# Patient Record
Sex: Female | Born: 1950 | Race: White | Hispanic: No | Smoking: Never smoker
Health system: Southern US, Community
[De-identification: ages and names within clinical notes are randomized; demographics above are authoritative.]

## PROBLEM LIST (undated history)

## (undated) DIAGNOSIS — E785 Hyperlipidemia, unspecified: Secondary | ICD-10-CM

## (undated) DIAGNOSIS — C4490 Unspecified malignant neoplasm of skin, unspecified: Secondary | ICD-10-CM

## (undated) DIAGNOSIS — Z923 Personal history of irradiation: Secondary | ICD-10-CM

## (undated) DIAGNOSIS — C50919 Malignant neoplasm of unspecified site of unspecified female breast: Secondary | ICD-10-CM

## (undated) HISTORY — PX: BUNIONECTOMY: SHX129

## (undated) HISTORY — PX: TUBAL LIGATION: SHX77

---

## 2001-01-03 DIAGNOSIS — C50919 Malignant neoplasm of unspecified site of unspecified female breast: Secondary | ICD-10-CM

## 2001-01-03 DIAGNOSIS — Z923 Personal history of irradiation: Secondary | ICD-10-CM

## 2001-01-03 HISTORY — DX: Malignant neoplasm of unspecified site of unspecified female breast: C50.919

## 2001-01-03 HISTORY — DX: Personal history of irradiation: Z92.3

## 2001-01-03 HISTORY — PX: BREAST LUMPECTOMY: SHX2

## 2001-01-03 HISTORY — PX: BREAST EXCISIONAL BIOPSY: SUR124

## 2003-11-20 ENCOUNTER — Ambulatory Visit: Payer: Self-pay | Admitting: Oncology

## 2004-01-26 ENCOUNTER — Ambulatory Visit: Payer: Self-pay | Admitting: General Surgery

## 2004-08-16 ENCOUNTER — Ambulatory Visit: Payer: Self-pay | Admitting: General Surgery

## 2005-08-10 ENCOUNTER — Emergency Department: Payer: Self-pay | Admitting: Emergency Medicine

## 2005-09-21 ENCOUNTER — Ambulatory Visit: Payer: Self-pay | Admitting: General Surgery

## 2006-04-04 ENCOUNTER — Ambulatory Visit: Payer: Self-pay | Admitting: Oncology

## 2006-04-26 ENCOUNTER — Ambulatory Visit: Payer: Self-pay | Admitting: Oncology

## 2006-05-04 ENCOUNTER — Ambulatory Visit: Payer: Self-pay | Admitting: Oncology

## 2006-08-09 ENCOUNTER — Ambulatory Visit: Payer: Self-pay | Admitting: Specialist

## 2006-10-04 ENCOUNTER — Ambulatory Visit: Payer: Self-pay | Admitting: Oncology

## 2006-10-25 ENCOUNTER — Ambulatory Visit: Payer: Self-pay | Admitting: Oncology

## 2006-10-30 ENCOUNTER — Ambulatory Visit: Payer: Self-pay | Admitting: General Surgery

## 2006-11-04 ENCOUNTER — Ambulatory Visit: Payer: Self-pay | Admitting: Oncology

## 2007-04-04 ENCOUNTER — Ambulatory Visit: Payer: Self-pay | Admitting: Oncology

## 2007-04-05 ENCOUNTER — Ambulatory Visit: Payer: Self-pay | Admitting: Specialist

## 2007-04-23 ENCOUNTER — Ambulatory Visit: Payer: Self-pay | Admitting: Oncology

## 2007-05-04 ENCOUNTER — Ambulatory Visit: Payer: Self-pay | Admitting: Oncology

## 2007-10-04 ENCOUNTER — Ambulatory Visit: Payer: Self-pay | Admitting: Oncology

## 2007-10-29 ENCOUNTER — Ambulatory Visit: Payer: Self-pay | Admitting: Oncology

## 2007-11-04 ENCOUNTER — Ambulatory Visit: Payer: Self-pay | Admitting: Oncology

## 2007-12-03 ENCOUNTER — Ambulatory Visit: Payer: Self-pay | Admitting: General Surgery

## 2008-10-27 ENCOUNTER — Ambulatory Visit: Payer: Self-pay | Admitting: Oncology

## 2008-11-03 ENCOUNTER — Ambulatory Visit: Payer: Self-pay | Admitting: Oncology

## 2008-11-10 ENCOUNTER — Ambulatory Visit: Payer: Self-pay | Admitting: Physician Assistant

## 2008-11-21 ENCOUNTER — Ambulatory Visit: Payer: Self-pay | Admitting: Internal Medicine

## 2008-11-21 ENCOUNTER — Emergency Department: Payer: Self-pay | Admitting: Emergency Medicine

## 2008-12-01 ENCOUNTER — Ambulatory Visit: Payer: Self-pay | Admitting: Oncology

## 2008-12-16 ENCOUNTER — Ambulatory Visit: Payer: Self-pay | Admitting: General Surgery

## 2009-11-23 ENCOUNTER — Ambulatory Visit: Payer: Self-pay | Admitting: Oncology

## 2009-12-03 ENCOUNTER — Ambulatory Visit: Payer: Self-pay | Admitting: Oncology

## 2010-01-03 ENCOUNTER — Ambulatory Visit: Payer: Self-pay | Admitting: Oncology

## 2010-11-23 ENCOUNTER — Ambulatory Visit: Payer: Self-pay | Admitting: Oncology

## 2010-12-04 ENCOUNTER — Ambulatory Visit: Payer: Self-pay | Admitting: Oncology

## 2010-12-21 ENCOUNTER — Ambulatory Visit: Payer: Self-pay | Admitting: Internal Medicine

## 2011-12-22 ENCOUNTER — Ambulatory Visit: Payer: Self-pay | Admitting: Internal Medicine

## 2012-09-26 ENCOUNTER — Ambulatory Visit: Payer: Self-pay | Admitting: Unknown Physician Specialty

## 2013-01-09 ENCOUNTER — Ambulatory Visit: Payer: Self-pay | Admitting: Internal Medicine

## 2014-02-10 ENCOUNTER — Ambulatory Visit: Payer: Self-pay | Admitting: Internal Medicine

## 2015-02-16 ENCOUNTER — Other Ambulatory Visit: Payer: Self-pay | Admitting: Internal Medicine

## 2015-02-16 DIAGNOSIS — Z1231 Encounter for screening mammogram for malignant neoplasm of breast: Secondary | ICD-10-CM

## 2015-02-19 ENCOUNTER — Ambulatory Visit
Admission: RE | Admit: 2015-02-19 | Discharge: 2015-02-19 | Disposition: A | Discharge status: Home / Self Care | Payer: Medicare Other | Source: Ambulatory Visit | Attending: Internal Medicine | Admitting: Internal Medicine

## 2015-02-19 ENCOUNTER — Other Ambulatory Visit: Payer: Self-pay | Admitting: Internal Medicine

## 2015-02-19 DIAGNOSIS — Z1231 Encounter for screening mammogram for malignant neoplasm of breast: Secondary | ICD-10-CM | POA: Insufficient documentation

## 2015-02-19 HISTORY — DX: Unspecified malignant neoplasm of skin, unspecified: C44.90

## 2015-02-19 HISTORY — DX: Malignant neoplasm of unspecified site of unspecified female breast: C50.919

## 2016-01-07 ENCOUNTER — Other Ambulatory Visit: Payer: Self-pay | Admitting: Internal Medicine

## 2016-01-07 DIAGNOSIS — Z1231 Encounter for screening mammogram for malignant neoplasm of breast: Secondary | ICD-10-CM

## 2016-02-22 ENCOUNTER — Ambulatory Visit
Admission: RE | Admit: 2016-02-22 | Discharge: 2016-02-22 | Disposition: A | Discharge status: Home / Self Care | Payer: Medicare Other | Source: Ambulatory Visit | Attending: Internal Medicine | Admitting: Internal Medicine

## 2016-02-22 DIAGNOSIS — Z1231 Encounter for screening mammogram for malignant neoplasm of breast: Secondary | ICD-10-CM | POA: Insufficient documentation

## 2017-03-15 ENCOUNTER — Other Ambulatory Visit: Payer: Self-pay | Admitting: Internal Medicine

## 2017-03-15 DIAGNOSIS — Z1231 Encounter for screening mammogram for malignant neoplasm of breast: Secondary | ICD-10-CM

## 2017-03-17 ENCOUNTER — Ambulatory Visit
Admission: RE | Admit: 2017-03-17 | Discharge: 2017-03-17 | Disposition: A | Discharge status: Home / Self Care | Payer: Medicare Other | Source: Ambulatory Visit | Attending: Internal Medicine | Admitting: Internal Medicine

## 2017-03-17 DIAGNOSIS — Z1231 Encounter for screening mammogram for malignant neoplasm of breast: Secondary | ICD-10-CM | POA: Diagnosis present

## 2017-03-17 HISTORY — DX: Personal history of irradiation: Z92.3

## 2018-03-16 ENCOUNTER — Other Ambulatory Visit: Payer: Self-pay | Admitting: Internal Medicine

## 2018-03-26 ENCOUNTER — Other Ambulatory Visit: Payer: Self-pay | Admitting: Internal Medicine

## 2018-03-26 DIAGNOSIS — M7989 Other specified soft tissue disorders: Secondary | ICD-10-CM

## 2018-04-17 ENCOUNTER — Other Ambulatory Visit: Payer: Self-pay | Admitting: Internal Medicine

## 2018-04-17 DIAGNOSIS — M7989 Other specified soft tissue disorders: Secondary | ICD-10-CM

## 2018-04-18 ENCOUNTER — Ambulatory Visit
Admission: RE | Admit: 2018-04-18 | Discharge: 2018-04-18 | Disposition: A | Discharge status: Home / Self Care | Payer: Medicare Other | Source: Ambulatory Visit | Attending: Internal Medicine | Admitting: Internal Medicine

## 2018-04-18 ENCOUNTER — Other Ambulatory Visit: Payer: Self-pay

## 2018-04-18 DIAGNOSIS — M7989 Other specified soft tissue disorders: Secondary | ICD-10-CM

## 2019-05-23 ENCOUNTER — Other Ambulatory Visit: Payer: Self-pay | Admitting: Internal Medicine

## 2019-05-23 DIAGNOSIS — Z1231 Encounter for screening mammogram for malignant neoplasm of breast: Secondary | ICD-10-CM

## 2019-05-29 ENCOUNTER — Ambulatory Visit
Admission: RE | Admit: 2019-05-29 | Discharge: 2019-05-29 | Disposition: A | Discharge status: Home / Self Care | Payer: Medicare PPO | Source: Ambulatory Visit | Attending: Internal Medicine | Admitting: Internal Medicine

## 2019-05-29 DIAGNOSIS — Z1231 Encounter for screening mammogram for malignant neoplasm of breast: Secondary | ICD-10-CM

## 2019-07-26 ENCOUNTER — Other Ambulatory Visit: Payer: Self-pay | Admitting: Student

## 2019-07-26 DIAGNOSIS — S83282A Other tear of lateral meniscus, current injury, left knee, initial encounter: Secondary | ICD-10-CM

## 2019-07-27 ENCOUNTER — Other Ambulatory Visit: Payer: Self-pay

## 2019-07-27 ENCOUNTER — Ambulatory Visit
Admission: RE | Admit: 2019-07-27 | Discharge: 2019-07-27 | Disposition: A | Discharge status: Home / Self Care | Payer: Medicare PPO | Source: Ambulatory Visit | Attending: Student | Admitting: Student

## 2019-07-27 DIAGNOSIS — S83282A Other tear of lateral meniscus, current injury, left knee, initial encounter: Secondary | ICD-10-CM | POA: Insufficient documentation

## 2019-08-21 ENCOUNTER — Other Ambulatory Visit: Payer: Self-pay | Admitting: Surgery

## 2019-08-26 ENCOUNTER — Encounter
Admission: RE | Admit: 2019-08-26 | Discharge: 2019-08-26 | Disposition: A | Discharge status: Home / Self Care | Payer: Medicare PPO | Source: Ambulatory Visit | Attending: Surgery | Admitting: Surgery

## 2019-08-26 ENCOUNTER — Other Ambulatory Visit: Payer: Self-pay

## 2019-08-26 HISTORY — DX: Hyperlipidemia, unspecified: E78.5

## 2019-08-26 NOTE — Patient Instructions (Signed)
Your procedure is scheduled on: 08/28/19 Report to [facility]. To find out your arrival time please call [phone] between 1PM - 3PM on 08/27/19.  Remember: Instructions that are not followed completely may result in serious medical risk, up to and including death, or upon the discretion of your surgeon and anesthesiologist your surgery may need to be rescheduled.     _X__ 1. Do not eat food after midnight the night before your procedure.                 No gum chewing or hard candies. You may drink clear liquids up to 2 hours                 before you are scheduled to arrive for your surgery- DO not drink clear                 liquids within 2 hours of the start of your surgery.                 Clear Liquids include:  water, apple juice without pulp, clear carbohydrate                 drink such as Clearfast or Gatorade, Black Coffee or Tea (Do not add                 anything to coffee or tea). Diabetics water only  ENSURE "CLEAR" PRE SURGERY DRINK TO E CONSUMED 2 HOURS BEFORE ARRIVING  __X__2.  On the morning of surgery brush your teeth with toothpaste and water, you                 may rinse your mouth with mouthwash if you wish.  Do not swallow any              toothpaste of mouthwash.     _X__ 3.  No Alcohol for 24 hours before or after surgery.   _X__ 4.  Do Not Smoke or use e-cigarettes For 24 Hours Prior to Your Surgery.                 Do not use any chewable tobacco products for at least 6 hours prior to                 surgery.  ____  5.  Bring all medications with you on the day of surgery if instructed.   __X__  6.  Notify your doctor if there is any change in your medical condition      (cold, fever, infections).     Do not wear jewelry, make-up, hairpins, clips or nail polish. Do not wear lotions, powders, or perfumes.  Do not shave 48 hours prior to surgery. Men may shave face and neck. Do not bring valuables  to the hospital.    [facility] is not responsible for any belongings or valuables.  Contacts, dentures/partials or body piercings may not be worn into surgery. Bring a case for your contacts, glasses or hearing aids, a denture cup will be supplied. Leave your suitcase in the car. After surgery it may be brought to your room. For patients admitted to the hospital, discharge time is determined by your treatment team.   Patients discharged the day of surgery will not be allowed to drive home.   Please read over the following fact sheets that you were given:   MRSA Information  __X__ Take  these medicines the morning of surgery with A SIP OF WATER:    1. ezetimibe (ZETIA) 10 MG tablet  2. venlafaxine XR (EFFEXOR-XR) 75 MG 24 hr capsule  3.   4.  5.  6.  ____ Fleet Enema (as directed)   __X__ Use CHG Soap/SAGE wipes as directed  ____ Use inhalers on the day of surgery  ____ Stop metformin/Janumet/Farxiga 2 days prior to surgery    ____ Take 1/2 of usual insulin dose the night before surgery. No insulin the morning          of surgery.   ____ Stop Blood Thinners Coumadin/Plavix/Xarelto/Pleta/Pradaxa/Eliquis/Effient/Aspirin  on   Or contact your Surgeon, Cardiologist or Medical Doctor regarding  ability to stop your blood thinners  __X__ Stop Anti-inflammatories 7 days before surgery such as Advil, Ibuprofen, Motrin,  BC or Goodies Powder, Naprosyn, Naproxen, Aleve, Aspirin    __X__ Stop all herbal supplements, fish oil or vitamin E until after surgery.    ____ Bring C-Pap to the hospital.     How to Use an Incentive Spirometer An incentive spirometer is a tool that measures how well you are filling your lungs with each breath. Learning to take long, deep breaths using this tool can help you keep your lungs clear and active. This may help to reverse or lessen your chance of developing breathing (pulmonary) problems, especially infection. You may be asked to use a  spirometer:  After a surgery.  If you have a lung problem or a history of smoking.  After a long period of time when you have been unable to move or be active. If the spirometer includes an indicator to show the highest number that you have reached, your health care provider or respiratory therapist will help you set a goal. Keep a list (log) of your progress as told by your health care provider. What are the risks?  Breathing too quickly may cause dizziness or cause you to pass out. Take your time so you do not get dizzy or light-headed.  If you are in pain, you may need to take pain medicine before doing incentive spirometry. It is harder to take a deep breath if you are having pain. How to use your incentive spirometer  1. Sit up on the edge of your bed or on a chair. 2. Hold the incentive spirometer so that it is in an upright position. 3. Before you use the spirometer, breathe out normally. 4. Place the mouthpiece in your mouth. Make sure your lips are closed tightly around it. 5. Breathe in slowly and as deeply as you can through your mouth, causing the piston or the ball to rise toward the top of the chamber. 6. Hold your breath for 3-5 seconds, or for as long as possible. ? If the spirometer includes a coach indicator, use this to guide you in breathing. Slow down your breathing if the indicator goes above the marked areas. 7. Remove the mouthpiece from your mouth and breathe out normally. The piston or ball will return to the bottom of the chamber. 8. Rest for a few seconds, then repeat the steps 10 or more times. ? Take your time and take a few normal breaths between deep breaths so that you do not get dizzy or light-headed. ? Do this every 1-2 hours when you are awake. 9. If the spirometer includes a goal marker to show the highest number you have reached (best effort), use this as a goal to work toward during  each repetition. 10. After each set of 10 deep breaths, cough a few  times. This will help to make sure that your lungs are clear. ? If you have an incision on your chest or abdomen from surgery, place a pillow or a rolled-up towel firmly against the incision when you cough. This can help to reduce pain from coughing. General tips  When you become able to get out of bed, walk around often and continue to cough to help clear your lungs.  Keep using the incentive spirometer until your health care provider says it is okay to stop using it. If you have been in the hospital, you may be told to keep using the spirometer at home. Contact a health care provider if:  You are having difficulty using the spirometer.  You have trouble using the spirometer as often as instructed.  Your pain medicine is not giving enough relief for you to use the spirometer as told.  You have a fever.  You develop shortness of breath. Get help right away if:  You develop a cough with bloody mucus from the lungs (bloody sputum).  You have fluid or blood coming from an incision site after you cough. Summary  An incentive spirometer is a tool that can help you learn to take long, deep breaths to keep your lungs clear and active.  You may be asked to use a spirometer after a surgery, if you have a lung problem or a history of smoking, or if you have been inactive for a long period of time.  Use your incentive spirometer as instructed every 1-2 hours while you are awake.  If you have an incision on your chest or abdomen, place a pillow or a rolled-up towel firmly against your incision when you cough. This will help to reduce pain. This information is not intended to replace advice given to you by your health care provider. Make sure you discuss any questions you have with your health care provider. Document Revised: 07/20/2018 Document Reviewed: 11/02/2016 Elsevier Patient Education  2020 [employer].

## 2019-08-27 ENCOUNTER — Other Ambulatory Visit: Payer: Medicare PPO

## 2019-08-27 ENCOUNTER — Other Ambulatory Visit
Admission: RE | Admit: 2019-08-27 | Discharge: 2019-08-27 | Disposition: A | Discharge status: Home / Self Care | Payer: Medicare PPO | Source: Ambulatory Visit | Attending: Surgery | Admitting: Surgery

## 2019-08-27 DIAGNOSIS — Z01818 Encounter for other preprocedural examination: Secondary | ICD-10-CM | POA: Insufficient documentation

## 2019-08-27 DIAGNOSIS — Z20822 Contact with and (suspected) exposure to covid-19: Secondary | ICD-10-CM | POA: Insufficient documentation

## 2019-08-27 LAB — SARS CORONAVIRUS 2 (TAT 6-24 HRS): SARS Coronavirus 2: NEGATIVE

## 2019-08-28 ENCOUNTER — Inpatient Hospital Stay: Admission: RE | Admit: 2019-08-28 | Payer: Medicare PPO | Source: Ambulatory Visit

## 2019-08-28 ENCOUNTER — Ambulatory Visit: Payer: Medicare PPO | Admitting: Anesthesiology

## 2019-08-28 ENCOUNTER — Ambulatory Visit
Admission: RE | Admit: 2019-08-28 | Discharge: 2019-08-28 | Disposition: A | Discharge status: Home / Self Care | Payer: Medicare PPO | Attending: Surgery | Admitting: Surgery

## 2019-08-28 ENCOUNTER — Other Ambulatory Visit: Payer: Self-pay

## 2019-08-28 ENCOUNTER — Encounter
Admission: RE | Disposition: A | Discharge status: Home / Self Care | Payer: Self-pay | Source: Home / Self Care | Attending: Surgery

## 2019-08-28 ENCOUNTER — Encounter: Payer: Self-pay | Admitting: Surgery

## 2019-08-28 DIAGNOSIS — I10 Essential (primary) hypertension: Secondary | ICD-10-CM | POA: Insufficient documentation

## 2019-08-28 DIAGNOSIS — F419 Anxiety disorder, unspecified: Secondary | ICD-10-CM | POA: Diagnosis not present

## 2019-08-28 DIAGNOSIS — Z79899 Other long term (current) drug therapy: Secondary | ICD-10-CM | POA: Diagnosis not present

## 2019-08-28 DIAGNOSIS — M1712 Unilateral primary osteoarthritis, left knee: Secondary | ICD-10-CM | POA: Diagnosis not present

## 2019-08-28 DIAGNOSIS — Y9389 Activity, other specified: Secondary | ICD-10-CM | POA: Diagnosis not present

## 2019-08-28 DIAGNOSIS — Z8249 Family history of ischemic heart disease and other diseases of the circulatory system: Secondary | ICD-10-CM | POA: Diagnosis not present

## 2019-08-28 DIAGNOSIS — Z809 Family history of malignant neoplasm, unspecified: Secondary | ICD-10-CM | POA: Insufficient documentation

## 2019-08-28 DIAGNOSIS — S83282A Other tear of lateral meniscus, current injury, left knee, initial encounter: Secondary | ICD-10-CM | POA: Diagnosis present

## 2019-08-28 DIAGNOSIS — Z923 Personal history of irradiation: Secondary | ICD-10-CM | POA: Insufficient documentation

## 2019-08-28 DIAGNOSIS — Z888 Allergy status to other drugs, medicaments and biological substances status: Secondary | ICD-10-CM | POA: Diagnosis not present

## 2019-08-28 DIAGNOSIS — S83272A Complex tear of lateral meniscus, current injury, left knee, initial encounter: Secondary | ICD-10-CM | POA: Insufficient documentation

## 2019-08-28 DIAGNOSIS — Z8042 Family history of malignant neoplasm of prostate: Secondary | ICD-10-CM | POA: Insufficient documentation

## 2019-08-28 DIAGNOSIS — F329 Major depressive disorder, single episode, unspecified: Secondary | ICD-10-CM | POA: Insufficient documentation

## 2019-08-28 DIAGNOSIS — Z803 Family history of malignant neoplasm of breast: Secondary | ICD-10-CM | POA: Insufficient documentation

## 2019-08-28 DIAGNOSIS — E785 Hyperlipidemia, unspecified: Secondary | ICD-10-CM | POA: Insufficient documentation

## 2019-08-28 DIAGNOSIS — W2209XA Striking against other stationary object, initial encounter: Secondary | ICD-10-CM | POA: Diagnosis not present

## 2019-08-28 DIAGNOSIS — Z853 Personal history of malignant neoplasm of breast: Secondary | ICD-10-CM | POA: Insufficient documentation

## 2019-08-28 DIAGNOSIS — Z791 Long term (current) use of non-steroidal anti-inflammatories (NSAID): Secondary | ICD-10-CM | POA: Insufficient documentation

## 2019-08-28 HISTORY — PX: KNEE ARTHROSCOPY WITH MENISCAL REPAIR: SHX5653

## 2019-08-28 SURGERY — ARTHROSCOPY, KNEE, WITH MENISCUS REPAIR
Anesthesia: General | Site: Knee | Laterality: Left

## 2019-08-28 MED ORDER — PROPOFOL 10 MG/ML IV BOLUS
INTRAVENOUS | Status: AC
  Filled 2019-08-28: qty 40

## 2019-08-28 MED ORDER — ONDANSETRON HCL 4 MG PO TABS: 4.0000 mg | ORAL_TABLET | Freq: Four times a day (QID) | ORAL | Status: DC | PRN

## 2019-08-28 MED ORDER — FENTANYL CITRATE (PF) 100 MCG/2ML IJ SOLN: 25.0000 ug | INTRAMUSCULAR | Status: DC | PRN

## 2019-08-28 MED ORDER — HYDROCODONE-ACETAMINOPHEN 5-325 MG PO TABS: 1.0000 | ORAL_TABLET | Freq: Four times a day (QID) | ORAL | 0 refills | Status: AC | PRN

## 2019-08-28 MED ORDER — DEXAMETHASONE SODIUM PHOSPHATE 10 MG/ML IJ SOLN
INTRAMUSCULAR | Status: DC | PRN
  Administered 2019-08-28: 5 mg via INTRAVENOUS

## 2019-08-28 MED ORDER — ONDANSETRON HCL 4 MG/2ML IJ SOLN
INTRAMUSCULAR | Status: DC | PRN
  Administered 2019-08-28: 4 mg via INTRAVENOUS

## 2019-08-28 MED ORDER — CEFAZOLIN SODIUM-DEXTROSE 2-4 GM/100ML-% IV SOLN
2.0000 g | INTRAVENOUS | Status: AC
  Administered 2019-08-28: 2 g via INTRAVENOUS

## 2019-08-28 MED ORDER — ORAL CARE MOUTH RINSE: 15.0000 mL | Freq: Once | OROMUCOSAL | Status: AC

## 2019-08-28 MED ORDER — METOCLOPRAMIDE HCL 5 MG/ML IJ SOLN: 5.0000 mg | Freq: Three times a day (TID) | INTRAMUSCULAR | Status: DC | PRN

## 2019-08-28 MED ORDER — PROPOFOL 10 MG/ML IV BOLUS
INTRAVENOUS | Status: DC | PRN
  Administered 2019-08-28: 150 mg via INTRAVENOUS

## 2019-08-28 MED ORDER — CHLORHEXIDINE GLUCONATE 0.12 % MT SOLN: 15.0000 mL | Freq: Once | OROMUCOSAL | Status: AC

## 2019-08-28 MED ORDER — EPHEDRINE SULFATE 50 MG/ML IJ SOLN
INTRAMUSCULAR | Status: DC | PRN
  Administered 2019-08-28 (×2): 5 mg via INTRAVENOUS

## 2019-08-28 MED ORDER — POTASSIUM CHLORIDE IN NACL 20-0.9 MEQ/L-% IV SOLN: INTRAVENOUS | Status: DC

## 2019-08-28 MED ORDER — FENTANYL CITRATE (PF) 100 MCG/2ML IJ SOLN
INTRAMUSCULAR | Status: AC
  Filled 2019-08-28: qty 2

## 2019-08-28 MED ORDER — LIDOCAINE HCL (PF) 1 % IJ SOLN
INTRAMUSCULAR | Status: AC
  Filled 2019-08-28: qty 30

## 2019-08-28 MED ORDER — FAMOTIDINE 20 MG PO TABS
ORAL_TABLET | ORAL | Status: AC
  Administered 2019-08-28: 20 mg via ORAL
  Filled 2019-08-28: qty 1

## 2019-08-28 MED ORDER — BUPIVACAINE-EPINEPHRINE (PF) 0.5% -1:200000 IJ SOLN
INTRAMUSCULAR | Status: AC
  Filled 2019-08-28: qty 30

## 2019-08-28 MED ORDER — FENTANYL CITRATE (PF) 100 MCG/2ML IJ SOLN
INTRAMUSCULAR | Status: AC
Start: 2019-08-28 — End: 2019-08-28
  Administered 2019-08-28: 50 ug via INTRAVENOUS
  Filled 2019-08-28: qty 2

## 2019-08-28 MED ORDER — METOCLOPRAMIDE HCL 10 MG PO TABS: 5.0000 mg | ORAL_TABLET | Freq: Three times a day (TID) | ORAL | Status: DC | PRN

## 2019-08-28 MED ORDER — CHLORHEXIDINE GLUCONATE 0.12 % MT SOLN
OROMUCOSAL | Status: AC
  Administered 2019-08-28: 15 mL via OROMUCOSAL
  Filled 2019-08-28: qty 15

## 2019-08-28 MED ORDER — FENTANYL CITRATE (PF) 100 MCG/2ML IJ SOLN
INTRAMUSCULAR | Status: DC | PRN
Start: 2019-08-28 — End: 2019-08-28
  Administered 2019-08-28 (×2): 50 ug via INTRAVENOUS

## 2019-08-28 MED ORDER — ONDANSETRON HCL 4 MG/2ML IJ SOLN: 4.0000 mg | Freq: Once | INTRAMUSCULAR | Status: DC | PRN

## 2019-08-28 MED ORDER — PHENYLEPHRINE HCL (PRESSORS) 10 MG/ML IV SOLN
INTRAVENOUS | Status: DC | PRN
  Administered 2019-08-28 (×7): 100 ug via INTRAVENOUS

## 2019-08-28 MED ORDER — FAMOTIDINE 20 MG PO TABS: 20.0000 mg | ORAL_TABLET | Freq: Once | ORAL | Status: AC

## 2019-08-28 MED ORDER — BUPIVACAINE-EPINEPHRINE (PF) 0.5% -1:200000 IJ SOLN
INTRAMUSCULAR | Status: DC | PRN
  Administered 2019-08-28: 50 mL

## 2019-08-28 MED ORDER — LIDOCAINE HCL (CARDIAC) PF 100 MG/5ML IV SOSY
PREFILLED_SYRINGE | INTRAVENOUS | Status: DC | PRN
  Administered 2019-08-28: 100 mg via INTRAVENOUS

## 2019-08-28 MED ORDER — HYDROCODONE-ACETAMINOPHEN 5-325 MG PO TABS: 1.0000 | ORAL_TABLET | ORAL | Status: DC | PRN

## 2019-08-28 MED ORDER — LIDOCAINE HCL 1 % IJ SOLN
INTRAMUSCULAR | Status: DC | PRN
  Administered 2019-08-28: 30 mL

## 2019-08-28 MED ORDER — CEFAZOLIN SODIUM-DEXTROSE 2-4 GM/100ML-% IV SOLN
INTRAVENOUS | Status: AC
  Filled 2019-08-28: qty 100

## 2019-08-28 MED ORDER — LACTATED RINGERS IV SOLN: INTRAVENOUS | Status: DC

## 2019-08-28 MED ORDER — ONDANSETRON HCL 4 MG/2ML IJ SOLN: 4.0000 mg | Freq: Four times a day (QID) | INTRAMUSCULAR | Status: DC | PRN

## 2019-08-28 SURGICAL SUPPLY — 45 items
ANCH SUT 4.5 FTPRNT PEEK-OPTM (Anchor) ×1 IMPLANT
ANCHOR 4.5 FOOTPRINT ULTRA (Anchor) ×3 IMPLANT
APL PRP STRL LF DISP 70% ISPRP (MISCELLANEOUS) ×1
BAG COUNTER SPONGE EZ (MISCELLANEOUS) IMPLANT
BAG SPNG 4X4 CLR HAZ (MISCELLANEOUS)
BLADE FULL RADIUS 3.5 (BLADE) ×3 IMPLANT
BLADE SHAVER 4.5X7 STR FR (MISCELLANEOUS) ×3 IMPLANT
BNDG ELASTIC 6X5.8 VLCR STR LF (GAUZE/BANDAGES/DRESSINGS) ×3 IMPLANT
CARTRIDGE REPAIR MENISCAL SZ 0 (Anchor) ×2 IMPLANT
CHLORAPREP W/TINT 26 (MISCELLANEOUS) ×3 IMPLANT
COUNTER SPONGE BAG EZ (MISCELLANEOUS)
COVER WAND RF STERILE (DRAPES) ×3 IMPLANT
CUFF TOURN SGL QUICK 24 (TOURNIQUET CUFF)
CUFF TOURN SGL QUICK 30 (TOURNIQUET CUFF)
CUFF TRNQT CYL 24X4X16.5-23 (TOURNIQUET CUFF) IMPLANT
CUFF TRNQT CYL 30X4X21-28X (TOURNIQUET CUFF) IMPLANT
ELECT REM PT RETURN 9FT ADLT (ELECTROSURGICAL) ×3
ELECTRODE REM PT RTRN 9FT ADLT (ELECTROSURGICAL) ×1 IMPLANT
GAUZE SPONGE 4X4 12PLY STRL (GAUZE/BANDAGES/DRESSINGS) ×3 IMPLANT
GLOVE BIO SURGEON STRL SZ8 (GLOVE) ×6 IMPLANT
GLOVE BIOGEL M 7.0 STRL (GLOVE) ×6 IMPLANT
GLOVE BIOGEL PI IND STRL 7.5 (GLOVE) ×1 IMPLANT
GLOVE BIOGEL PI INDICATOR 7.5 (GLOVE) ×2
GLOVE INDICATOR 8.0 STRL GRN (GLOVE) ×3 IMPLANT
GOWN STRL REUS W/ TWL LRG LVL3 (GOWN DISPOSABLE) ×1 IMPLANT
GOWN STRL REUS W/ TWL XL LVL3 (GOWN DISPOSABLE) ×2 IMPLANT
GOWN STRL REUS W/TWL LRG LVL3 (GOWN DISPOSABLE) ×3
GOWN STRL REUS W/TWL XL LVL3 (GOWN DISPOSABLE) ×6
IV LACTATED RINGER IRRG 3000ML (IV SOLUTION) ×6
IV LR IRRIG 3000ML ARTHROMATIC (IV SOLUTION) ×2 IMPLANT
KIT MENISCAL ROOT REPAIR (KITS) ×3 IMPLANT
KIT TURNOVER KIT A (KITS) ×3 IMPLANT
MANAGER SUT NOVOCUT (CUTTER) ×3 IMPLANT
MANIFOLD NEPTUNE II (INSTRUMENTS) ×3 IMPLANT
MENISCAL REPAIR CARTRIDGE SZ 0 (Anchor) ×6 IMPLANT
MENISCAL REPAIR SYSTEM SZ 0 (Anchor) ×6 IMPLANT
NEEDLE HYPO 21X1.5 SAFETY (NEEDLE) ×3 IMPLANT
PACK KNEE ARTHRO (MISCELLANEOUS) ×3 IMPLANT
PENCIL ELECTRO HAND CTR (MISCELLANEOUS) ×3 IMPLANT
SUT PROLENE 4 0 PS 2 18 (SUTURE) ×3 IMPLANT
SUT TICRON COATED BLUE 2 0 30 (SUTURE) IMPLANT
SYR 50ML LL SCALE MARK (SYRINGE) ×3 IMPLANT
SYSTEM REPAIR MENISCAL SZ 0 (Anchor) ×2 IMPLANT
TUBING ARTHRO INFLOW-ONLY STRL (TUBING) ×3 IMPLANT
WAND WEREWOLF FLOW 90D (MISCELLANEOUS) ×3 IMPLANT

## 2019-08-28 NOTE — Anesthesia Preprocedure Evaluation (Signed)
Anesthesia Evaluation  Patient identified by MRN, date of birth, ID band Patient awake    Reviewed: Allergy & Precautions, H&P , NPO status , Patient's Chart, lab work & pertinent test results, reviewed documented beta blocker date and time   History of Anesthesia Complications Negative for: history of anesthetic complications  Airway Mallampati: II  TM Distance: >3 FB Neck ROM: full    Dental  (+) Dental Advidsory Given, Caps, Teeth Intact   Pulmonary neg pulmonary ROS,    Pulmonary exam normal breath sounds clear to auscultation       Cardiovascular Exercise Tolerance: Good hypertension, (-) angina(-) CAD, (-) Past MI and (-) Cardiac Stents Normal cardiovascular exam(-) dysrhythmias + Valvular Problems/Murmurs (as a teenager)  Rhythm:regular Rate:Normal     Neuro/Psych negative neurological ROS  negative psych ROS   GI/Hepatic negative GI ROS, Neg liver ROS,   Endo/Other  negative endocrine ROS  Renal/GU negative Renal ROS  negative genitourinary   Musculoskeletal   Abdominal   Peds  Hematology negative hematology ROS (+)   Anesthesia Other Findings Past Medical History: 2003: Breast cancer ([place])     Comment:  Left - radiation No date: HLD (hyperlipidemia) 2003: Personal history of radiation therapy     Comment:  Left lumpectomy No date: 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   Reproductive/Obstetrics negative OB ROS                             Anesthesia Physical Anesthesia Plan  ASA: II  Anesthesia Plan: General   Post-op Pain Management:    Induction: Intravenous  PONV Risk Score and Plan: 2 and Ondansetron, Dexamethasone and Treatment may vary due to age or medical condition  Airway Management Planned: LMA  Additional Equipment:   Intra-op Plan:   Post-operative Plan: Extubation in OR  Informed Consent: I have reviewed the patients History and  Physical, chart, labs and discussed the procedure including the risks, benefits and alternatives for the proposed anesthesia with the patient or authorized representative who has indicated his/her understanding and acceptance.     Dental Advisory Given  Plan Discussed with: Anesthesiologist, CRNA and Surgeon  Anesthesia Plan Comments:         Anesthesia Quick Evaluation

## 2019-08-28 NOTE — Transfer of Care (Signed)
Immediate Anesthesia Transfer of Care Note  Patient: Adriana Guzman  Procedure(s) Performed: KNEE ARTHROSCOPY WITH DEBRIDEMENT AND REPAIR OF LATERAL MENISCAL ROOT TEAR. (Left Knee)  Patient Location: PACU  Anesthesia Type:General  Level of Consciousness: awake and alert   Airway & Oxygen Therapy: Patient Spontanous Breathing  Post-op Assessment: Report given to RN and Post -op Vital signs reviewed and stable  Post vital signs: Reviewed and stable  Last Vitals:  Vitals Value Taken Time  BP 127/68 08/28/19 1047  Temp 36.3 C 08/28/19 1046  Pulse 87 08/28/19 1049  Resp 15 08/28/19 1049  SpO2 98 % 08/28/19 1049  Vitals shown include unvalidated device data.  Last Pain:  Vitals:   08/28/19 1046  TempSrc:   PainSc: 0-No pain         Complications: No complications documented.

## 2019-08-28 NOTE — Discharge Instructions (Addendum)
   AMBULATORY SURGERY  DISCHARGE INSTRUCTIONS   1) The drugs that you were given will stay in your system until tomorrow so for the next 24 hours you should not:  A) Drive an automobile B) Make any legal decisions C) Drink any alcoholic beverage   2) You may resume regular meals tomorrow.  Today it is better to start with liquids and gradually work up to solid foods.  You may eat anything you prefer, but it is better to start with liquids, then soup and crackers, and gradually work up to solid foods.   3) Please notify your doctor immediately if you have any unusual bleeding, trouble breathing, redness and pain at the surgery site, drainage, fever, or pain not relieved by medication.    4) Additional Instructions:        Please contact your physician with any problems or Same Day Surgery at [phone], Monday through Friday 6 am to 4 pm, or [place] at [facility] number at [phone].  Orthopedic discharge instructions: Keep dressing dry and intact.  May shower after dressing changed on post-op day #4 (Sunday).  Cover staples with Band-Aids after drying off. Apply ice frequently to knee. Take ibuprofen 600-800 mg TID with meals for 7-10 days, then as necessary. Take pain medication as prescribed when needed.  May supplement with ES Tylenol if necessary. No weight-bearing on left leg - use crutches or [place]. Follow-up in 10-14 days or as scheduled.

## 2019-08-28 NOTE — H&P (Signed)
History of Present Illness: Adriana Guzman is a 70 y.o.female who is being referred by Cameron Proud, PA-C, for left knee pain. The symptoms began several weeks ago and developed as result of an injury in which to [place] dogs ran up the steps to her and struck her in the left knee, causing him to bang against the railing. She felt a pop in her knee associated with sudden pain. She presented to an urgent care facility where x-rays were unremarkable. However, she was placed into a knee immobilizer and referred to orthopedics where she saw Cameron Proud, PA-C. Because of the concern for a meniscal tear, she was sent for an MRI scan and referred to me for further evaluation and treatment. She reports 5/10 pain. The pain is located along the lateral and posterior aspect of the knee. The pain is described as aching, stabbing and throbbing. The symptoms are aggravated with normal daily activities, using stairs, at higher levels of activity, walking, standing, standing pivot and activity in general. She also describes no mechanical symptoms. She has associated swelling and no deformity. She has tried acetaminophen, anti-inflammatories, narcotic medications, bracing and ice with limited benefit. The patient denies any prior injury to her knee, and denies any numbness or paresthesias down her leg to her foot.  Current Outpatient Medications: . acetaminophen (TYLENOL ARTHRITIS ORAL) Take 650 mg by mouth as needed  . ezetimibe (ZETIA) 10 mg tablet Take 1 tablet (10 mg total) by mouth once daily 90 tablet 3  . levothyroxine (SYNTHROID) 25 MCG tablet Take 1 tablet (25 mcg total) by mouth once daily Take on an empty stomach with a glass of water at least 30-60 minutes before breakfast. 90 tablet 3  . meloxicam (MOBIC) 15 MG tablet Take 1 tablet (15 mg total) by mouth once daily for 30 doses Take daily as needed. 30 tablet 0  . telmisartan (MICARDIS) 40 MG tablet Take 1 tablet (40 mg total) by mouth once daily  90 tablet 1  . venlafaxine (EFFEXOR-XR) 75 MG XR capsule Take 1 capsule (75 mg total) by mouth 2 (two) times daily 180 capsule 3   No current Epic-ordered facility-administered medications on file.   Allergies:  . Statins-Hmg-Coa Reductase Inhibitors Liver Disorder and Muscle Pain   Past Medical History:  . Anxiety  . Borderline hypertension  . Breast cancer (CMS-HCC)  status post lumpectomy with lymph node dissection and adjuvant radiation therapy.  . Depression  . Hyperlipidemia  . Hypertension September, 2015  . Mastitis  recurrent  . Menopausal state   Past Surgical History:  . Status post lumpectomy with lymph node dissection for breast cancer  . TUBAL LIGATION Bilateral   Family History:  . Heart disease Mother  . Cancer Mother  . Breast cancer Mother  . Myocardial Infarction (Heart attack) Father  . Coronary Artery Disease (Blocked arteries around heart) Father  . Prostate cancer Brother   Social History   Socioeconomic History  . Marital status: Divorced  Spouse name: Not on file  . Number of children: Not on file  . Years of education: Not on file  . Highest education level: Not on file  Occupational History  . Not on file  Tobacco Use  . Smoking status: Never Smoker  . Smokeless tobacco: Never Used  Substance and Sexual Activity  . Alcohol use: Never  Alcohol/week: 0.0 standard drinks  . Drug use: No  . Sexual activity: Not Currently  Partners: Male  Other Topics Concern  . Not  on file  Social History Narrative  . Not on file   Social Determinants of Health   Financial Resource Strain:  . Difficulty of Paying Living Expenses:  Food Insecurity:  . Worried About Charity fundraiser in the Last Year:  . Arboriculturist in the Last Year:  Transportation Needs:  . Film/video editor (Medical):  Marland Kitchen Lack of Transportation (Non-Medical):   Review of Systems:  A comprehensive 14 point ROS was performed, reviewed, and the pertinent orthopaedic  findings are documented in the HPI.  Physical Exam: Vitals:  08/02/19 0836  BP: 130/82  Weight: 76.9 kg (169 lb 9.6 oz)  Height: 172.7 cm (5\' 8" )  PainSc: 5  PainLoc: Knee   General/Constitutional: The patient appears to be well-nourished, well-developed, and in no acute distress. Neuro/Psych: Normal mood and affect, oriented to person, place and time. Eyes: Non-icteric. Pupils are equal, round, and reactive to light, and exhibit synchronous movement. Lymphatic: No palpable adenopathy. Respiratory: Lungs clear to auscultation, Normal chest excursion, No wheezes and Non-labored breathing Cardiovascular: Regular rate and rhythm. No murmurs. and No edema, swelling or tenderness, except as noted in detailed exam. Vascular: No edema, swelling or tenderness, except as noted in detailed exam. Integumentary: No impressive skin lesions present, except as noted in detailed exam. Musculoskeletal: Unremarkable, except as noted in detailed exam.  Left knee exam: GAIT: and uses  ALIGNMENT: normal SKIN: unremarkable SWELLING: minimal EFFUSION: absent WARMTH: no warmth TENDERNESS: moderate over the posterior lateral knee region and mild over the anteromedial joint line ROM: 5 to 125 degrees with pain at the extremes of flexion and extension. McMURRAY'S: equivocal PATELLOFEMORAL: normal tracking with no peri-patellar tenderness and negative apprehension sign CREPITUS: no LACHMAN'S: negative PIVOT SHIFT: negative ANTERIOR DRAWER: negative POSTERIOR DRAWER: negative VARUS/VALGUS: stable  She is neurovascularly intact to the left lower extremity and foot.  Knee Imaging, external: Left knee: A recent MRI scan of the left knee is available for review. By report, the scan demonstrates evidence of a posterior meniscal root of the lateral meniscus. The medial meniscus is in satisfactory condition, as the cruciate and collateral ligaments. There is mild chondromalacial changes involving the patella,  but the remaining areas of articular cartilage all appear to be in satisfactory condition. Both the films and report were reviewed by myself and discussed with the patient.  Assessment:  . Complex tear of lateral meniscus of left knee.   Plan: The treatment options were discussed with the patient. In addition, patient educational materials were provided regarding the diagnosis and treatment options. The patient is quite frustrated by her symptoms and functional limitations, and is ready to consider more aggressive treatment options. Therefore, I have recommended a surgical procedure, specifically a left knee arthroscopy with debridement and repair of her lateral meniscal root tear. The procedure was discussed with the patient, as were the potential risks (including bleeding, infection, nerve and/or blood vessel injury, persistent or recurrent pain, failure of the repair, progression of arthritis, need for further surgery, blood clots, strokes, heart attacks and/or arhythmias, pneumonia, etc.) and benefits. The patient states her understanding and wishes to proceed. All of the patient's questions and concerns were answered. She can call any time with further concerns. She has been fitted with a postoperative hinged knee range of motion brace to wear as necessary for comfort prior to surgery, but to definitely wear after surgery for at least 6 weeks. She will follow up post-surgery, routine.   H&P reviewed and patient re-examined. No  changes.

## 2019-08-28 NOTE — Op Note (Signed)
08/28/2019  10:57 AM  Patient:   Adriana Guzman  Pre-Op Diagnosis:   Lateral meniscus root tear, left knee.  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  Procedure:   Arthroscopic debridement with arthroscopically-assisted repair of lateral meniscus root tear, left knee.  Surgeon:   Pascal Lux, M.D.  Assistant:   None  Anesthesia:   General LMA.  Findings:   As above.  The medial meniscus was in excellent condition, as were the anterior and posterior cruciate ligaments.  There were grade 2-3 chondromalacial changes involving the femoral trochlea and grade 1 chondromalacial changes involving the medial and lateral femoral condyles.  Complications:   None.  EBL:   5 cc.  Total fluids:   600 cc of crystalloid.  Tourniquet time:   None  Drains:   None  Closure:   Staples.  Brief clinical note:   The patient is a 69 year old female who sustained the above-noted injury 6 to 7 weeks ago when she was brought into by two exuberant dogs. These symptoms have persisted despite medications, activity modification, etc. The patient's history and examination were consistent with a lateral meniscus tear. An MRI scan demonstrated the presence of a lateral meniscus root tear. The patient presents at this time for arthroscopy, debridement, and repair versus partial lateral meniscectomy of her left knee.  Procedure:   The patient was brought into the operating room and lain in the supine position. After adequate general laryngeal mask anesthesia was obtained, a timeout was performed to verify the appropriate side. The patient's left knee was injected sterilely using a solution of 30 cc of 1% lidocaine and 30 cc of 0.5% Sensorcaine with epinephrine. The left lower extremity was prepped with ChloraPrep solution before being draped sterilely. Preoperative antibiotics were administered. The expected portal sites were injected with 0.5%  Sensorcaine with epinephrine before the camera was placed in the anterolateral portal and instrumentation performed through the anteromedial portal. The knee was sequentially examined beginning in the suprapatellar pouch, then progressing to the patellofemoral space, the medial gutter compartment, the notch, and finally the lateral compartment and gutter. The findings were as described above. Abundant reactive synovial tissues anteriorly were debrided using the full-radius resector in order to improve visualization.   The lateral meniscus was carefully probed and demonstrated an unstable tear of the meniscal root. This was repaired using the [facility] meniscal root repair system. The end of the tear was freshened with a full-radius resector before the attachment site on the proximal tibia was curetted and debrided with the full-radius resector to expose good bleeding bone. The [facility] guide was positioned in the over-the-top position and, utilizing the 55 degree angle setting, the drill/sleeve combination was drilled up into the proximal tibia through a short anterior incision. Once its position was verified intra-articularly, the central drill was removed, leaving the sleeve in place. A looped passing suture was placed up through the retained sleeve and pulled out through the anteromedial wound. Utilizing the FirstPass suture passer, three #2 FiberWire sutures were placed into the meniscal root and tied securely. Using the passing loop, the FiberWire was drawn down through the drill hole in the proximal tibia and brought out anteriorly. A single [employer] anchor was placed in the anterior tibial cortex to secure the sutures. The repair was assessed and found to be stable to probing. It also appeared to be stable with range of motion of the  knee. The instruments were removed from the joint after suctioning the excess fluid.   The skin incision was closed using 4-0 Prolene interrupted  sutures. The portal sites also were closed using 4-0 Prolene interrupted sutures. A sterile bulky dressing was applied to the knee before the patient was placed into a hinged knee brace with the hinges set at 0-90, but locked in extension. The patient was then awakened, extubated, and returned to the recovery room in satisfactory condition after tolerating the procedure well.

## 2019-08-28 NOTE — Anesthesia Procedure Notes (Signed)
Procedure Name: LMA Insertion Date/Time: 08/28/2019 9:02 AM Performed by: Louann Sjogren, CRNA Pre-anesthesia Checklist: Patient identified, Patient being monitored, Timeout performed, Emergency Drugs available and Suction available Patient Re-evaluated:Patient Re-evaluated prior to induction Oxygen Delivery Method: Circle system utilized Preoxygenation: Pre-oxygenation with 100% oxygen Induction Type: IV induction Ventilation: Mask ventilation without difficulty LMA: LMA inserted LMA Size: 4.5 Tube type: Oral Number of attempts: 1 Placement Confirmation: positive ETCO2 and breath sounds checked- equal and bilateral Tube secured with: Tape Dental Injury: Teeth and Oropharynx as per pre-operative assessment

## 2019-08-29 NOTE — Anesthesia Postprocedure Evaluation (Signed)
Anesthesia Post Note  Patient: Marlowe Shores  Procedure(s) Performed: KNEE ARTHROSCOPY WITH DEBRIDEMENT AND REPAIR OF LATERAL MENISCAL ROOT TEAR. (Left Knee)  Patient location during evaluation: PACU Anesthesia Type: General Level of consciousness: awake and alert Pain management: pain level controlled Vital Signs Assessment: post-procedure vital signs reviewed and stable Respiratory status: spontaneous breathing, nonlabored ventilation, respiratory function stable and patient connected to nasal cannula oxygen Cardiovascular status: blood pressure returned to baseline and stable Postop Assessment: no apparent nausea or vomiting Anesthetic complications: no   No complications documented.   Last Vitals:  Vitals:   08/28/19 1131 08/28/19 1155  BP: 135/77 136/88  Pulse: 86 88  Resp: 18 16  Temp: (!) 36.2 C (!) 35.7 C  SpO2: 98% 98%    Last Pain:  Vitals:   08/28/19 1155  TempSrc: Temporal  PainSc: 1                  Martha Clan

## 2019-08-29 NOTE — Progress Notes (Signed)
°   08/28/19 0755  Clinical Encounter Type  Visited With Patient;Family  Visit Type Initial  Referral From Chaplain  Consult/Referral To Chaplain  While rounding [facility] waiting are, chaplain stopped and spoke with pt's sister-in-law, Vicky. Chaplain was checking to see how she was doing and she said fine. Vicky sent a message to her sister-in-law. This gavae chaplain the opportunity to meet patient. Chaplain delivered message and pt smiled. Chaplain wished her well and left.

## 2019-09-03 ENCOUNTER — Other Ambulatory Visit: Payer: Medicare PPO

## 2020-06-09 ENCOUNTER — Other Ambulatory Visit: Payer: Self-pay | Admitting: Internal Medicine

## 2020-06-09 DIAGNOSIS — Z1231 Encounter for screening mammogram for malignant neoplasm of breast: Secondary | ICD-10-CM

## 2020-06-12 ENCOUNTER — Other Ambulatory Visit: Payer: Self-pay

## 2020-06-12 ENCOUNTER — Ambulatory Visit
Admission: RE | Admit: 2020-06-12 | Discharge: 2020-06-12 | Disposition: A | Discharge status: Home / Self Care | Payer: Medicare PPO | Source: Ambulatory Visit | Attending: Internal Medicine | Admitting: Internal Medicine

## 2020-06-12 DIAGNOSIS — Z1231 Encounter for screening mammogram for malignant neoplasm of breast: Secondary | ICD-10-CM | POA: Diagnosis present

## 2021-06-03 ENCOUNTER — Other Ambulatory Visit: Payer: Self-pay | Admitting: Internal Medicine

## 2021-06-03 DIAGNOSIS — Z1231 Encounter for screening mammogram for malignant neoplasm of breast: Secondary | ICD-10-CM

## 2021-06-18 ENCOUNTER — Ambulatory Visit
Admission: RE | Admit: 2021-06-18 | Discharge: 2021-06-18 | Disposition: A | Discharge status: Home / Self Care | Payer: Medicare PPO | Source: Ambulatory Visit | Attending: Internal Medicine | Admitting: Internal Medicine

## 2021-06-18 DIAGNOSIS — Z1231 Encounter for screening mammogram for malignant neoplasm of breast: Secondary | ICD-10-CM | POA: Insufficient documentation

## 2021-09-09 ENCOUNTER — Other Ambulatory Visit: Payer: Self-pay | Admitting: Internal Medicine

## 2021-09-09 DIAGNOSIS — M79605 Pain in left leg: Secondary | ICD-10-CM

## 2021-09-13 ENCOUNTER — Ambulatory Visit
Admission: RE | Admit: 2021-09-13 | Discharge: 2021-09-13 | Disposition: A | Discharge status: Home / Self Care | Payer: Medicare PPO | Source: Ambulatory Visit | Attending: Internal Medicine | Admitting: Internal Medicine

## 2021-09-13 DIAGNOSIS — M79605 Pain in left leg: Secondary | ICD-10-CM | POA: Diagnosis present

## 2021-09-23 ENCOUNTER — Other Ambulatory Visit: Payer: Self-pay | Admitting: Orthopedic Surgery

## 2021-09-23 DIAGNOSIS — S83207A Unspecified tear of unspecified meniscus, current injury, left knee, initial encounter: Secondary | ICD-10-CM

## 2021-09-23 DIAGNOSIS — M25562 Pain in left knee: Secondary | ICD-10-CM

## 2021-10-06 ENCOUNTER — Ambulatory Visit
Admission: RE | Admit: 2021-10-06 | Discharge: 2021-10-06 | Disposition: A | Discharge status: Home / Self Care | Payer: Medicare PPO | Source: Ambulatory Visit | Attending: Orthopedic Surgery | Admitting: Orthopedic Surgery

## 2021-10-06 DIAGNOSIS — M25562 Pain in left knee: Secondary | ICD-10-CM

## 2021-10-06 DIAGNOSIS — S83207A Unspecified tear of unspecified meniscus, current injury, left knee, initial encounter: Secondary | ICD-10-CM

## 2022-02-03 DEATH — deceased

## 2022-02-20 IMAGING — MR MR KNEE*L* W/O CM
7 series · 40 of 40 positions shown · non-contrast
Comparison: None.

CLINICAL DATA: Twisting injury of the left knee 2 weeks ago.
General pain.

EXAM:
MRI OF THE LEFT KNEE WITHOUT CONTRAST
TECHNIQUE: Multiplanar, multisequence MR imaging of the knee was performed. No
intravenous contrast was administered.

[Series 8: T2 fat-sat · axial · left · 4.0mm · 0.50mm/px · z∈[-81,+44]mm · 6 of 26 slices shown (1 of 3)]
[im 1/26]
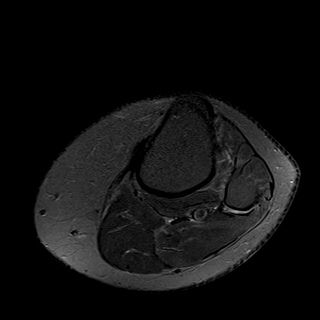
[im 6/26]
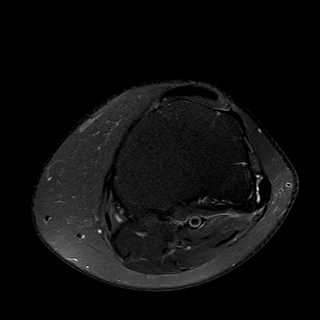
[im 11/26]
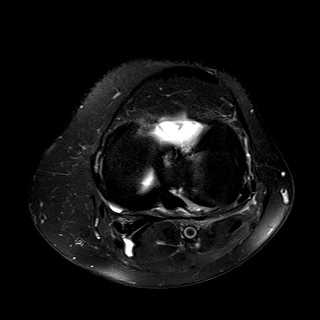
[im 16/26]
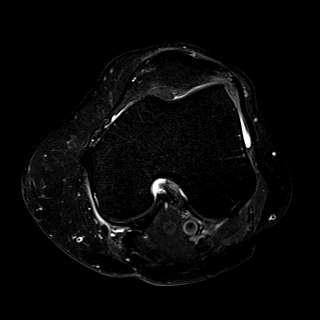
[im 21/26]
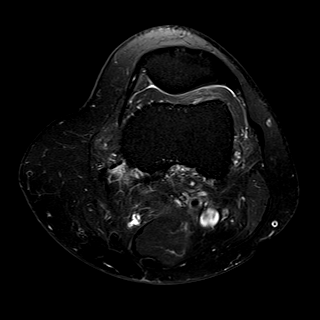
[im 26/26]
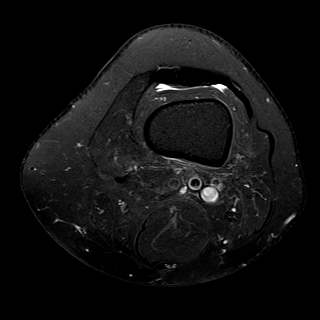

[Series 9: T2 fat-sat · coronal · left · 4.0mm · 0.59mm/px · 6 of 30 slices shown (2 of 3)]
[im 1/30]
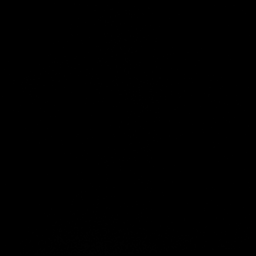
[im 6/30]
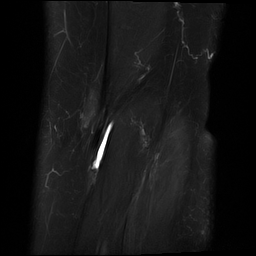
[im 12/30]
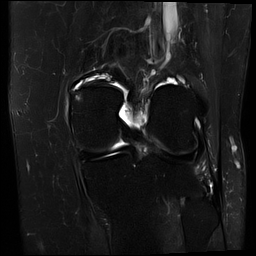
[im 18/30]
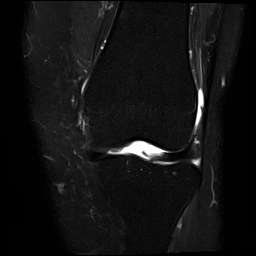
[im 24/30]
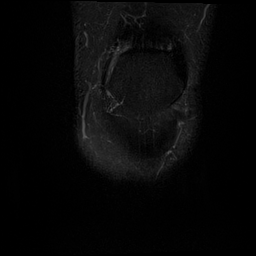
[im 30/30]
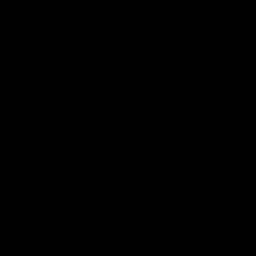

[Series 10: T1 · coronal · left · 4.0mm · 0.59mm/px · 6 of 30 slices shown]
[im 1/30]
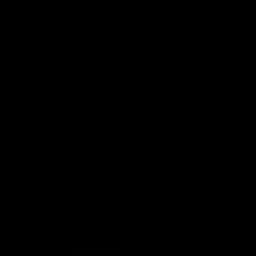
[im 6/30]
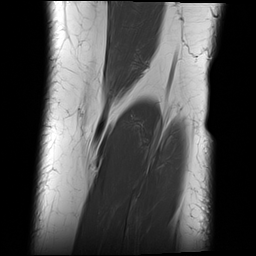
[im 12/30]
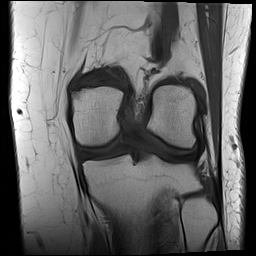
[im 18/30]
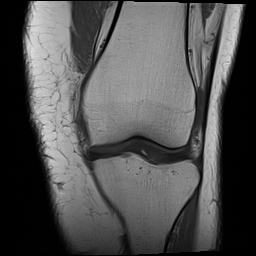
[im 24/30]
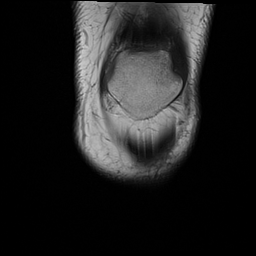
[im 30/30]
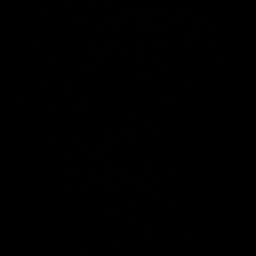

[Series 11: PD fat-sat · coronal · left · 4.0mm · 0.59mm/px · 6 of 30 slices shown (1 of 2)]
[im 1/30]
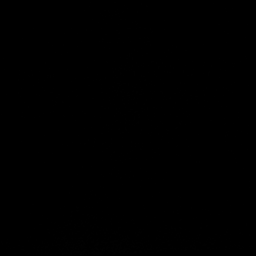
[im 6/30]
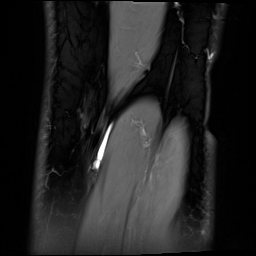
[im 12/30]
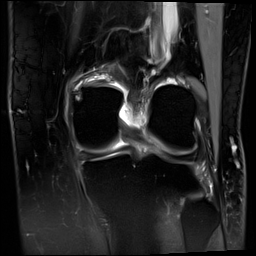
[im 18/30]
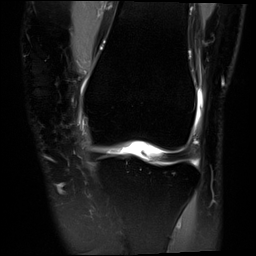
[im 24/30]
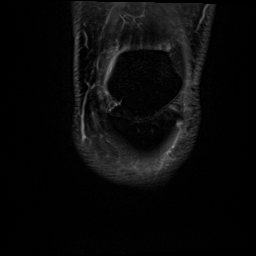
[im 30/30]
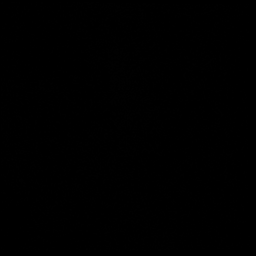

[Series 12: PD fat-sat · sagittal · left · 3.0mm · 0.59mm/px · 6 of 31 slices shown (2 of 2)]
[im 1/31]
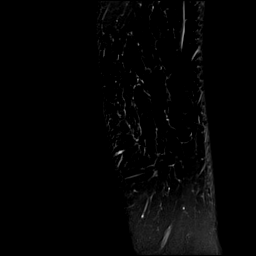
[im 7/31]
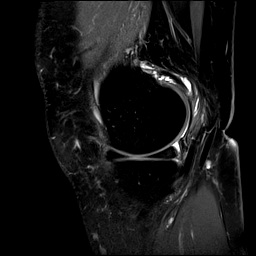
[im 13/31]
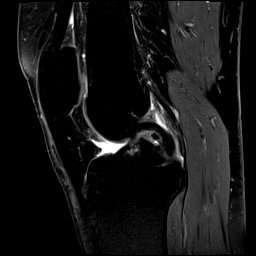
[im 19/31]
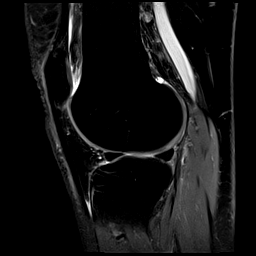
[im 25/31]
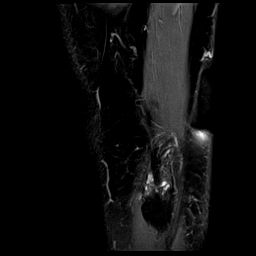
[im 31/31]
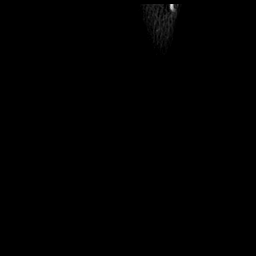

[Series 13: T2 fat-sat · sagittal · left · 3.0mm · 0.59mm/px · 7 of 36 slices shown (3 of 3)]
[im 1/36]
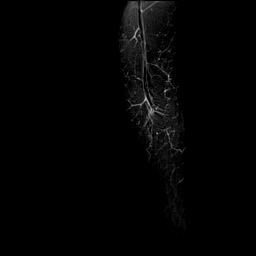
[im 6/36]
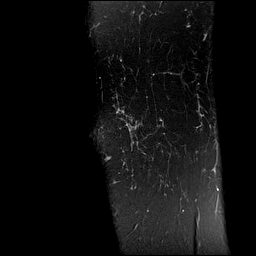
[im 12/36]
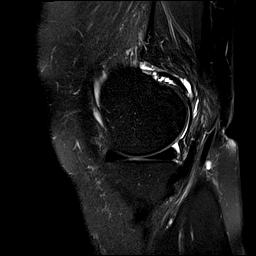
[im 18/36]
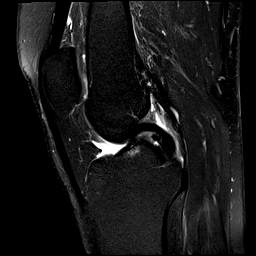
[im 24/36]
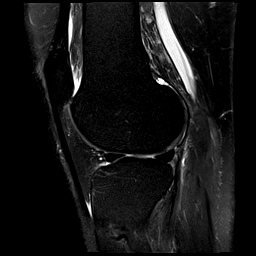
[im 30/36]
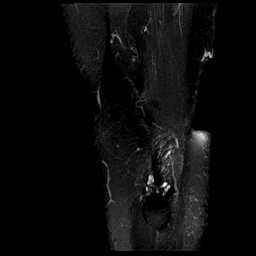
[im 36/36]
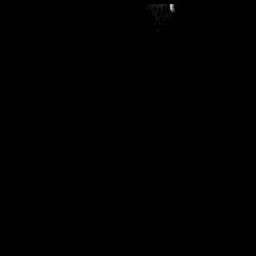

[Series 14: PD · coronal · left · 2.0mm · 0.47mm/px · 3 of 16 slices shown]
[im 1/16]
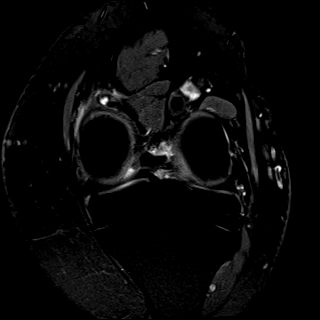
[im 8/16]
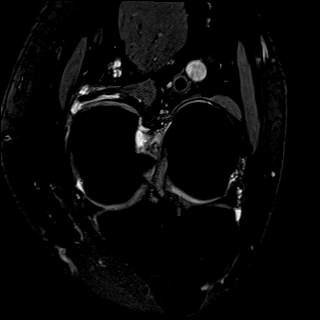
[im 16/16]
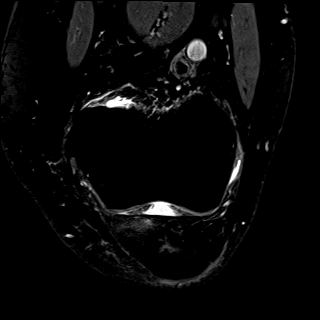

[40 of 40 positions shown; findings below may reference images not displayed]

FINDINGS: MENISCI

Medial: No discrete meniscal tear. Degeneration of the posterior
horn of the medial meniscus. Fluid signal at the posterior periphery
of the posterior horn of the medial meniscus at the meniscal
capsular junction as can be seen with a meniscocapsular injury
(image 12/series 12).

Lateral: Radial tear of the root of the posterior horn of the
lateral meniscus.

LIGAMENTS

Cruciates: ACL and PCL are intact.

Collaterals: Medial collateral ligament is intact. Lateral
collateral ligament complex is intact.

CARTILAGE

Patellofemoral: Partial-thickness cartilage loss of the patellar
apex and medial patellar facet.

Medial:  No chondral defect.

Lateral:  No chondral defect.

JOINT: No joint effusion. Normal [place]. No plical
thickening.

POPLITEAL FOSSA: Popliteus tendon is intact. Tiny Baker's cyst.

EXTENSOR MECHANISM: Intact quadriceps tendon. Intact patellar
tendon. Intact lateral patellar retinaculum. Intact medial patellar
retinaculum. Intact MPFL.

BONES: No marrow signal abnormality. No fracture or dislocation.

Other: No fluid collection or hematoma. Small multiloculated
ganglion cyst at the origin of the medial gastrocnemius tendon.
Partial thickness tear of the origin of the medial gastrocnemius
tendon. Muscles are normal.
IMPRESSION: 1. Radial tear of the root of the posterior horn of the lateral
meniscus.
2. Partial-thickness cartilage loss of the patellar apex and medial
patellar facet.
3. No discrete medial meniscal tear. Degeneration of the posterior
horn of the medial meniscus. Fluid signal at the posterior periphery
of the posterior horn of the medial meniscus at the meniscocapsular
junction as can be seen with a meniscocapsular injury (image
12/series 12).
